# Patient Record
Sex: Male | Born: 1990 | Race: White | Hispanic: No | Marital: Single | Smoking: Current every day smoker
Health system: Southern US, Community
[De-identification: ages and names within clinical notes are randomized; demographics above are authoritative.]

---

## 2015-08-31 ENCOUNTER — Emergency Department
Admission: EM | Admit: 2015-08-31 | Discharge: 2015-09-01 | Disposition: A | Discharge status: Home / Self Care | Payer: Self-pay | Attending: Emergency Medicine | Admitting: Emergency Medicine

## 2015-08-31 DIAGNOSIS — F329 Major depressive disorder, single episode, unspecified: Secondary | ICD-10-CM | POA: Insufficient documentation

## 2015-08-31 DIAGNOSIS — Y9389 Activity, other specified: Secondary | ICD-10-CM | POA: Insufficient documentation

## 2015-08-31 DIAGNOSIS — Y9289 Other specified places as the place of occurrence of the external cause: Secondary | ICD-10-CM | POA: Insufficient documentation

## 2015-08-31 DIAGNOSIS — Z7289 Other problems related to lifestyle: Secondary | ICD-10-CM

## 2015-08-31 DIAGNOSIS — F10120 Alcohol abuse with intoxication, uncomplicated: Secondary | ICD-10-CM | POA: Insufficient documentation

## 2015-08-31 DIAGNOSIS — S60812A Abrasion of left wrist, initial encounter: Secondary | ICD-10-CM | POA: Insufficient documentation

## 2015-08-31 DIAGNOSIS — F10920 Alcohol use, unspecified with intoxication, uncomplicated: Secondary | ICD-10-CM

## 2015-08-31 DIAGNOSIS — Y998 Other external cause status: Secondary | ICD-10-CM | POA: Insufficient documentation

## 2015-08-31 DIAGNOSIS — IMO0002 Reserved for concepts with insufficient information to code with codable children: Secondary | ICD-10-CM

## 2015-08-31 DIAGNOSIS — X789XXA Intentional self-harm by unspecified sharp object, initial encounter: Secondary | ICD-10-CM | POA: Insufficient documentation

## 2015-08-31 DIAGNOSIS — Z23 Encounter for immunization: Secondary | ICD-10-CM | POA: Insufficient documentation

## 2015-08-31 DIAGNOSIS — S50812A Abrasion of left forearm, initial encounter: Secondary | ICD-10-CM | POA: Insufficient documentation

## 2015-08-31 DIAGNOSIS — F32A Depression, unspecified: Secondary | ICD-10-CM

## 2015-08-31 LAB — COMPREHENSIVE METABOLIC PANEL
ALT: 24 U/L (ref 17–63)
ANION GAP: 8 (ref 5–15)
AST: 23 U/L (ref 15–41)
Albumin: 4.6 g/dL (ref 3.5–5.0)
Alkaline Phosphatase: 93 U/L (ref 38–126)
BUN: 13 mg/dL (ref 6–20)
CHLORIDE: 108 mmol/L (ref 101–111)
CO2: 24 mmol/L (ref 22–32)
Calcium: 9.1 mg/dL (ref 8.9–10.3)
Creatinine, Ser: 1.09 mg/dL (ref 0.61–1.24)
GFR calc non Af Amer: >60 mL/min (ref >60)
Glucose, Bld: 110 mg/dL — ABNORMAL HIGH (ref 65–99)
Potassium: 3.7 mmol/L (ref 3.5–5.1)
SODIUM: 140 mmol/L (ref 135–145)
Total Bilirubin: 0.4 mg/dL (ref 0.3–1.2)
Total Protein: 7.6 g/dL (ref 6.5–8.1)

## 2015-08-31 LAB — URINE DRUG SCREEN, QUALITATIVE (ARMC ONLY)
Amphetamines, Ur Screen: NOT DETECTED
BARBITURATES, UR SCREEN: NOT DETECTED
Benzodiazepine, Ur Scrn: NOT DETECTED
COCAINE METABOLITE, UR ~~LOC~~: NOT DETECTED
Cannabinoid 50 Ng, Ur ~~LOC~~: NOT DETECTED
MDMA (ECSTASY) UR SCREEN: NOT DETECTED
METHADONE SCREEN, URINE: NOT DETECTED
Opiate, Ur Screen: NOT DETECTED
Phencyclidine (PCP) Ur S: NOT DETECTED
TRICYCLIC, UR SCREEN: NOT DETECTED

## 2015-08-31 LAB — ETHANOL: Alcohol, Ethyl (B): 134 mg/dL — ABNORMAL HIGH (ref <5)

## 2015-08-31 LAB — CBC
HCT: 45.8 % (ref 40.0–52.0)
HEMOGLOBIN: 15.5 g/dL (ref 13.0–18.0)
MCH: 29.6 pg (ref 26.0–34.0)
MCHC: 33.8 g/dL (ref 32.0–36.0)
MCV: 87.7 fL (ref 80.0–100.0)
Platelets: 260 10*3/uL (ref 150–440)
RBC: 5.23 MIL/uL (ref 4.40–5.90)
RDW: 12.3 % (ref 11.5–14.5)
WBC: 7.9 10*3/uL (ref 3.8–10.6)

## 2015-08-31 LAB — ACETAMINOPHEN LEVEL

## 2015-08-31 LAB — SALICYLATE LEVEL

## 2015-08-31 MED ORDER — TETANUS-DIPHTH-ACELL PERTUSSIS 5-2.5-18.5 LF-MCG/0.5 IM SUSP
0.5000 mL | Freq: Once | INTRAMUSCULAR | Status: AC
  Administered 2015-08-31: 0.5 mL via INTRAMUSCULAR
  Filled 2015-08-31: qty 0.5

## 2015-08-31 NOTE — ED Provider Notes (Signed)
[facility] Emergency Department Provider Note ____________________________________________  Time seen: Approximately 11:26 PM  I have reviewed the triage vital signs and the nursing notes.   HISTORY  Chief Complaint Behavior Problem    HPI George Leach is a 24 y.o. male who presents to the ED under IVC with [facility] police s/p argument with girlfriend. Patient presents with superficial lacerationsto his left wrist from intentional self injury. Patient admits to cutting self previously to "relieve stress". Denies active SI/HI/AH/VH. Has been drinking heavily this evening. Denies medical complaints. Tetanus is not up-to-date.   Past medical history Self-mutilation  There are no active problems to display for this patient.   No past surgical history on file.  No current outpatient prescriptions on file.  Allergies Review of patient's allergies indicates no known allergies.  No family history on file.  Social History Social History  Substance Use Topics  . Smoking status: Not on file  . Smokeless tobacco: Not on file  . Alcohol Use: Not on file  +Smoker +EtOH  Review of Systems Constitutional: No fever/chills Eyes: No visual changes. ENT: No sore throat. Cardiovascular: Denies chest pain. Respiratory: Denies shortness of breath. Gastrointestinal: No abdominal pain.  No nausea, no vomiting.  No diarrhea.  No constipation. Genitourinary: Negative for dysuria. Musculoskeletal: Negative for back pain. Skin: Negative for rash. Neurological: Negative for headaches, focal weakness or numbness. Psychiatric: Positive for depression.  10-point ROS otherwise negative.  ____________________________________________   PHYSICAL EXAM:  VITAL SIGNS: ED Triage Vitals  Enc Vitals Group     BP 08/31/15 2238 125/81 mmHg     Pulse Rate 08/31/15 2238 100     Resp 08/31/15 2238 18     Temp 08/31/15 2238 98.7 F (37.1 C)     Temp Source 08/31/15  2238 Oral     SpO2 08/31/15 2238 99 %     Weight 08/31/15 2238 160 lb (72.576 kg)     Height 08/31/15 2238  (1.778 m)     Head Cir --      Peak Flow --      Pain Score 08/31/15 2239 2     Pain Loc --      Pain Edu? --      Excl. in GC? --     Constitutional: Alert and oriented. Well appearing and in no acute distress. Eyes: Conjunctivae are normal. PERRL. EOMI. Head: Atraumatic. Nose: No congestion/rhinnorhea. Mouth/Throat: Mucous membranes are moist.  Oropharynx non-erythematous. Neck: No stridor.   Cardiovascular: Normal rate, regular rhythm. Grossly normal heart sounds.  Good peripheral circulation. Respiratory: Normal respiratory effort.  No retractions. Lungs CTAB. Gastrointestinal: Soft and nontender. No distention. No abdominal bruits. No CVA tenderness. Musculoskeletal: No lower extremity tenderness nor edema.  No joint effusions. Neurologic:  Normal speech and language. No gross focal neurologic deficits are appreciated. No gait instability. Skin:  Skin is warm and dry. There are multiple linear abrasions to the left wrist and forearm without active bleeding or laceration which requires sutures. There are old, well-healed linear scars on the tops of patient's bilateral thighs from prior cutting. No rash noted. Psychiatric: Mood and affect are normal. Speech and behavior are normal.  ____________________________________________   LABS (all labs ordered are listed, but only abnormal results are displayed)  Labs Reviewed  COMPREHENSIVE METABOLIC PANEL - Abnormal; Notable for the following:    Glucose, Bld 110 (*)    All other components within normal limits  ETHANOL - Abnormal; Notable for the following:  Alcohol, Ethyl (B) 134 (*)    All other components within normal limits  ACETAMINOPHEN LEVEL - Abnormal; Notable for the following:    Acetaminophen (Tylenol), Serum <10 (*)    All other components within normal limits  SALICYLATE LEVEL  CBC  URINE DRUG  SCREEN, QUALITATIVE ([facility] ONLY)   ____________________________________________  EKG  None ____________________________________________  RADIOLOGY  None ____________________________________________   PROCEDURES  Procedure(s) performed: None  Critical Care performed: No  ____________________________________________   INITIAL IMPRESSION / ASSESSMENT AND PLAN / ED COURSE  Pertinent labs & imaging results that were available during my care of the patient were reviewed by me and considered in my medical decision making (see chart for details).  24 year old male with a history of cutting self presents under IVC by [employer] s/p argument with girlfriend. Denies active SI. Will maintain IVC pending [facility] psychiatry consult.  ----------------------------------------- 2:17 AM on 09/01/2015 -----------------------------------------  Patient was evaluated by [facility] psychiatrist Dr. Charmian Muff who has reversed his IVC and refers him to mental health clinic. Strict return precautions given. Patient verbalizes understanding and agrees with plan of care. ____________________________________________   FINAL CLINICAL IMPRESSION(S) / ED DIAGNOSES  Final diagnoses:  Self-inflicted injury  Alcohol intoxication, uncomplicated  Depression      Irean Hong, MD 09/01/15 [phone]

## 2015-08-31 NOTE — ED Notes (Signed)
Pt to ED under IVC with [employer], pt had an argument with girlfriend. Pt presents with superficial lacerations to L wrist. Pt states he is "not able to answer" why he did it.

## 2015-09-01 NOTE — ED Notes (Signed)
BEHAVIORAL HEALTH ROUNDING Patient sleeping: No. Patient alert and oriented: yes Behavior appropriate: Yes.  ; If no, describe:  Nutrition and fluids offered: Yes  Toileting and hygiene offered: Yes  Sitter present: yes Law enforcement present: Yes  

## 2015-09-01 NOTE — ED Notes (Addendum)
BEHAVIORAL HEALTH ROUNDING Patient sleeping: No. Patient alert and oriented: yes Behavior appropriate: Yes.  ; If no, describe:  Nutrition and fluids offered: Yes  Toileting and hygiene offered: Yes  Sitter present: yes Law enforcement present: Yes  

## 2015-09-01 NOTE — Discharge Instructions (Signed)
1. Please call the number provided to seek treatment for depression. 2. Return to the ER for worsening symptoms, feeling of hurting herself or others, or other concerns.  Alcohol Intoxication Alcohol intoxication occurs when the amount of alcohol that a person has consumed impairs his or her ability to mentally and physically function. Alcohol directly impairs the normal chemical activity of the brain. Drinking large amounts of alcohol can lead to changes in mental function and behavior, and it can cause many physical effects that can be harmful.  Alcohol intoxication can range in severity from mild to very severe. Various factors can affect the level of intoxication that occurs, such as the person's age, gender, weight, frequency of alcohol consumption, and the presence of other medical conditions (such as diabetes, seizures, or heart conditions). Dangerous levels of alcohol intoxication may occur when people drink large amounts of alcohol in a short period (binge drinking). Alcohol can also be especially dangerous when combined with certain prescription medicines or "recreational" drugs. SIGNS AND SYMPTOMS Some common signs and symptoms of mild alcohol intoxication include:  Loss of coordination.  Changes in mood and behavior.  Impaired judgment.  Slurred speech. As alcohol intoxication progresses to more severe levels, other signs and symptoms will appear. These may include:  Vomiting.  Confusion and impaired memory.  Slowed breathing.  Seizures.  Loss of consciousness. DIAGNOSIS  Your health care provider will take a medical history and perform a physical exam. You will be asked about the amount and type of alcohol you have consumed. Blood tests will be done to measure the concentration of alcohol in your blood. In many places, your blood alcohol level must be lower than 80 mg/dL (8.11%) to legally drive. However, many dangerous effects of alcohol can occur at much lower levels.    TREATMENT  People with alcohol intoxication often do not require treatment. Most of the effects of alcohol intoxication are temporary, and they go away as the alcohol naturally leaves the body. Your health care provider will monitor your condition until you are stable enough to go home. Fluids are sometimes given through an IV access tube to help prevent dehydration.  HOME CARE INSTRUCTIONS  Do not drive after drinking alcohol.  Stay hydrated. Drink enough water and fluids to keep your urine clear or pale yellow. Avoid caffeine.   Only take over-the-counter or prescription medicines as directed by your health care provider.  SEEK MEDICAL CARE IF:   You have persistent vomiting.   You do not feel better after a few days.  You have frequent alcohol intoxication. Your health care provider can help determine if you should see a substance use treatment counselor. SEEK IMMEDIATE MEDICAL CARE IF:   You become shaky or tremble when you try to stop drinking.   You shake uncontrollably (seizure).   You throw up (vomit) blood. This may be bright red or may look like black coffee grounds.   You have blood in your stool. This may be bright red or may appear as a black, tarry, bad smelling stool.   You become lightheaded or faint.  MAKE SURE YOU:   Understand these instructions.  Will watch your condition.  Will get help right away if you are not doing well or get worse. Document Released: 09/11/2005 Document Revised: 08/04/2013 Document Reviewed: 05/07/2013 [facility] Patient Information 2015 [place], [place]. This information is not intended to replace advice given to you by your health care provider. Make sure you discuss any questions you have with your  health care provider.  Depression Depression is feeling sad, low, down in the dumps, blue, gloomy, or empty. In general, there are two kinds of depression:  Normal sadness or grief. This can happen after something upsetting. It  often goes away on its own within 2 weeks. After losing a loved one (bereavement), normal sadness and grief may last longer than two weeks. It usually gets better with time.  Clinical depression. This kind lasts longer than normal sadness or grief. It keeps you from doing the things you normally do in life. It is often hard to function at home, work, or at school. It may affect your relationships with others. Treatment is often needed. GET HELP RIGHT AWAY IF:  You have thoughts about hurting yourself or others.  You lose touch with reality (psychotic symptoms). You may:  See or hear things that are not real.  Have untrue beliefs about your life or people around you.  Your medicine is giving you problems. MAKE SURE YOU:  Understand these instructions.  Will watch your condition.  Will get help right away if you are not doing well or get worse. Document Released: 01/04/2011 Document Revised: 04/18/2014 Document Reviewed: 04/02/2012 [facility] Patient Information 2015 [place], [place]. This information is not intended to replace advice given to you by your health care provider. Make sure you discuss any questions you have with your health care provider.

## 2015-09-01 NOTE — ED Notes (Signed)

## 2015-09-01 NOTE — BH Assessment (Signed)
Assessment Note  George Leach is an 24 y.o. male presenting to the ED under IVC by [employer] after getting into an argument with his girlfriend.  Pt has superficial cuts on his arms and legs.  Pt denies cutting to end his life but as coping mechanism to cope with stress and anxiety.  Pt reports ongoing relationship conflict with his girlfriend.  Pt reports drinking 3 or 4 12 oz beers a day.  Pt denies any other drug use.  Pt denies any SI/HI and A/V hallucinations.  Pt reports he has never received any type of treatment for his anxieties but is interested in getting help.  Axis I: See current hospital problem list  Past Medical History: No past medical history on file.  No past surgical history on file.  Family History: No family history on file.  Social History:  has no tobacco, alcohol, and drug history on file.  Additional Social History:  Alcohol / Drug Use History of alcohol / drug use?: Yes Substance #1 Name of Substance 1: ETOH 1 - Age of First Use: 23 1 - Amount (size/oz): 4 12 oz beers 1 - Frequency: daily 1 - Duration: after work 1 - Last Use / Amount: 08/31/2015  CIWA: CIWA-Ar BP: 125/81 mmHg Pulse Rate: 100 COWS:    Allergies: No Known Allergies  Home Medications:  (Not in a hospital admission)  OB/GYN Status:  No LMP for male patient.  General Assessment Data Location of Assessment: [facility] ED TTS Assessment: In system Is this a Tele or Face-to-Face Assessment?: Face-to-Face Is this an Initial Assessment or a Re-assessment for this encounter?: Initial Assessment Marital status: Single Maiden name: N/A Is patient pregnant?: No Pregnancy Status: No Living Arrangements: Spouse/significant other Can pt return to current living arrangement?: Yes Admission Status: Involuntary Is patient capable of signing voluntary admission?: Yes Referral Source: Other Insurance type: None  Medical Screening Exam [facility] Walk-in ONLY) Medical Exam completed: Yes  Crisis Care  Plan Living Arrangements: Spouse/significant other Name of Psychiatrist: None Name of Therapist: None  Education Status Is patient currently in school?: No Current Grade: N/A Highest grade of school patient has completed: 11th Name of school: N/A Contact person: N/A  Risk to self with the past 6 months Suicidal Ideation: No Has patient been a risk to self within the past 6 months prior to admission? : No Suicidal Intent: No Has patient had any suicidal intent within the past 6 months prior to admission? : No Is patient at risk for suicide?: No Suicidal Plan?: No Has patient had any suicidal plan within the past 6 months prior to admission? : No Access to Means: No What has been your use of drugs/alcohol within the last 12 months?: 4 12 oz beers daily Previous Attempts/Gestures: No How many times?: 0 Other Self Harm Risks: N/A Triggers for Past Attempts: None known Intentional Self Injurious Behavior: Cutting Comment - Self Injurious Behavior: Pt has superficial cuts on arms and legs Family Suicide History: Unknown Recent stressful life event(s): Conflict (Comment) (Relationships issues with girlfriend) Persecutory voices/beliefs?: No Depression: Yes Depression Symptoms: Loss of interest in usual pleasures, Feeling worthless/self pity Substance abuse history and/or treatment for substance abuse?: No Suicide prevention information given to non-admitted patients: Not applicable  Risk to Others within the past 6 months Homicidal Ideation: No Does patient have any lifetime risk of violence toward others beyond the six months prior to admission? : No Thoughts of Harm to Others: No Current Homicidal Intent: No Current Homicidal Plan: No  Access to Homicidal Means: No Identified Victim: N/A History of harm to others?: No Assessment of Violence: On admission Violent Behavior Description: N/A Does patient have access to weapons?: No Criminal Charges Pending?: No Does patient  have a court date: No Is patient on probation?: No  Psychosis Hallucinations: None noted Delusions: None noted  Mental Status Report Appearance/Hygiene: In scrubs Eye Contact: Fair Motor Activity: Freedom of movement Speech: Logical/coherent, Soft Level of Consciousness: Alert Mood: Anxious Affect: Anxious, Appropriate to circumstance Anxiety Level: Minimal Thought Processes: Coherent Judgement: Partial Orientation: Person, Place, Situation, Time Obsessive Compulsive Thoughts/Behaviors: None  Cognitive Functioning Concentration: Normal Memory: Recent Intact IQ: Average Insight: Poor Impulse Control: Poor Appetite: Fair Weight Loss: 0 Weight Gain: 0 Sleep: No Change Vegetative Symptoms: None  ADLScreening [facility] Assessment Services) Patient's cognitive ability adequate to safely complete daily activities?: Yes Patient able to express need for assistance with ADLs?: Yes Independently performs ADLs?: Yes (appropriate for developmental age)  Prior Inpatient Therapy Prior Inpatient Therapy: No Prior Therapy Dates: N/A Prior Therapy Facilty/Provider(s): N/A Reason for Treatment: N/A  Prior Outpatient Therapy Prior Outpatient Therapy: No Prior Therapy Dates: N/A Prior Therapy Facilty/Provider(s): N/A Reason for Treatment: N/A Does patient have an ACCT team?: No Does patient have Intensive In-House Services?  : No Does patient have [facility] services? : No Does patient have P4CC services?: No  ADL Screening (condition at time of admission) Patient's cognitive ability adequate to safely complete daily activities?: Yes Patient able to express need for assistance with ADLs?: Yes Independently performs ADLs?: Yes (appropriate for developmental age)       Abuse/Neglect Assessment (Assessment to be complete while patient is alone) Physical Abuse: Denies Verbal Abuse: Denies Sexual Abuse: Denies Exploitation of patient/patient's resources: Denies Self-Neglect:  Denies Values / Beliefs Cultural Requests During Hospitalization: None Spiritual Requests During Hospitalization: None Consults Spiritual Care Consult Needed: No Social Work Consult Needed: No Merchant navy officer (For Healthcare) Does patient have an advance directive?: No Would patient like information on creating an advanced directive?: No - patient declined information    Additional Information 1:1 In Past 12 Months?: No CIRT Risk: No Elopement Risk: No     Disposition:  Disposition Initial Assessment Completed for this Encounter: Yes Disposition of Patient: Other dispositions Other disposition(s): Other (Comment) [facility] consult)  On Site Evaluation by:   Reviewed with Physician:    Roxana C Brand 09/01/2015 1:14 AM

## 2016-04-20 ENCOUNTER — Emergency Department: Payer: Self-pay

## 2016-04-20 ENCOUNTER — Encounter: Payer: Self-pay | Admitting: Emergency Medicine

## 2016-04-20 ENCOUNTER — Emergency Department
Admission: EM | Admit: 2016-04-20 | Discharge: 2016-04-21 | Disposition: A | Discharge status: Home / Self Care | Payer: Self-pay | Attending: Emergency Medicine | Admitting: Emergency Medicine

## 2016-04-20 DIAGNOSIS — F1910 Other psychoactive substance abuse, uncomplicated: Secondary | ICD-10-CM

## 2016-04-20 DIAGNOSIS — F1410 Cocaine abuse, uncomplicated: Secondary | ICD-10-CM | POA: Insufficient documentation

## 2016-04-20 DIAGNOSIS — R569 Unspecified convulsions: Secondary | ICD-10-CM | POA: Insufficient documentation

## 2016-04-20 DIAGNOSIS — F17210 Nicotine dependence, cigarettes, uncomplicated: Secondary | ICD-10-CM | POA: Insufficient documentation

## 2016-04-20 LAB — URINE DRUG SCREEN, QUALITATIVE (ARMC ONLY)
AMPHETAMINES, UR SCREEN: POSITIVE — AB
Barbiturates, Ur Screen: NOT DETECTED
Benzodiazepine, Ur Scrn: POSITIVE — AB
Cannabinoid 50 Ng, Ur ~~LOC~~: POSITIVE — AB
Cocaine Metabolite,Ur ~~LOC~~: POSITIVE — AB
MDMA (ECSTASY) UR SCREEN: NOT DETECTED
Methadone Scn, Ur: NOT DETECTED
Opiate, Ur Screen: NOT DETECTED
Phencyclidine (PCP) Ur S: NOT DETECTED
TRICYCLIC, UR SCREEN: POSITIVE — AB

## 2016-04-20 LAB — CBC WITH DIFFERENTIAL/PLATELET
BASOS ABS: 0.1 10*3/uL (ref 0–0.1)
EOS ABS: 0.2 10*3/uL (ref 0–0.7)
HCT: 48 % (ref 40.0–52.0)
Hemoglobin: 16.1 g/dL (ref 13.0–18.0)
Lymphocytes Relative: 15 %
Lymphs Abs: 1.6 10*3/uL (ref 1.0–3.6)
MCH: 29.1 pg (ref 26.0–34.0)
MCHC: 33.6 g/dL (ref 32.0–36.0)
MCV: 86.6 fL (ref 80.0–100.0)
Monocytes Absolute: 1 10*3/uL (ref 0.2–1.0)
Monocytes Relative: 9 %
Neutro Abs: 8.4 10*3/uL — ABNORMAL HIGH (ref 1.4–6.5)
Neutrophils Relative %: 73 %
PLATELETS: 272 10*3/uL (ref 150–440)
RBC: 5.54 MIL/uL (ref 4.40–5.90)
RDW: 13.4 % (ref 11.5–14.5)
WBC: 11.3 10*3/uL — AB (ref 3.8–10.6)

## 2016-04-20 LAB — TROPONIN I: Troponin I: <0.03 ng/mL (ref <0.031)

## 2016-04-20 LAB — ETHANOL

## 2016-04-20 LAB — COMPREHENSIVE METABOLIC PANEL
ALT: 20 U/L (ref 17–63)
AST: 32 U/L (ref 15–41)
Albumin: 4.7 g/dL (ref 3.5–5.0)
Alkaline Phosphatase: 92 U/L (ref 38–126)
Anion gap: 12 (ref 5–15)
BUN: 14 mg/dL (ref 6–20)
CHLORIDE: 104 mmol/L (ref 101–111)
CO2: 21 mmol/L — AB (ref 22–32)
CREATININE: 1.53 mg/dL — AB (ref 0.61–1.24)
Calcium: 9.2 mg/dL (ref 8.9–10.3)
GFR calc Af Amer: >60 mL/min (ref >60)
GFR calc non Af Amer: >60 mL/min (ref >60)
Glucose, Bld: 203 mg/dL — ABNORMAL HIGH (ref 65–99)
Potassium: 3.5 mmol/L (ref 3.5–5.1)
SODIUM: 137 mmol/L (ref 135–145)
Total Bilirubin: 1.1 mg/dL (ref 0.3–1.2)
Total Protein: 8 g/dL (ref 6.5–8.1)

## 2016-04-20 MED ORDER — DIAZEPAM 5 MG/ML IJ SOLN
5.0000 mg | Freq: Once | INTRAMUSCULAR | Status: AC
  Administered 2016-04-20: 5 mg via INTRAVENOUS
  Filled 2016-04-20: qty 2

## 2016-04-20 MED ORDER — KETOROLAC TROMETHAMINE 30 MG/ML IJ SOLN
30.0000 mg | Freq: Once | INTRAMUSCULAR | Status: AC
  Administered 2016-04-20: 30 mg via INTRAVENOUS
  Filled 2016-04-20: qty 1

## 2016-04-20 MED ORDER — SODIUM CHLORIDE 0.9 % IV SOLN
Freq: Once | INTRAVENOUS | Status: AC
  Administered 2016-04-20: 23:00:00 via INTRAVENOUS

## 2016-04-20 NOTE — ED Provider Notes (Signed)
[facility] Emergency Department Provider Note        Time seen: ----------------------------------------- 10:37 PM on 04/20/2016 -----------------------------------------    I have reviewed the triage vital signs and the nursing notes.   HISTORY  Chief Complaint Seizures; Fall; and Back Pain    HPI George Leach is a 25 y.o. male who presents to ER after having had a seizure event today. His girlfriend states he was using cocaine, drinking, had had an Adderall, was playing video games and had not slept in 2 days. She states he started shaking this lasted 3-5 minutes, he was then confused for about 20 minutes. She states during the event he quit breathing and is now turned purple. Since that time his had chest and back pain. Patient denies a need for detox. He has never had a seizure before.   History reviewed. No pertinent past medical history.  There are no active problems to display for this patient.   History reviewed. No pertinent past surgical history.  Allergies Review of patient's allergies indicates no known allergies.  Social History Social History  Substance Use Topics  . Smoking status: Current Every Day Smoker -- 0.50 packs/day    Types: Cigarettes  . Smokeless tobacco: None  . Alcohol Use: Yes    Review of Systems Constitutional: Negative for fever. Eyes: Negative for visual changes. ENT: Negative for sore throat. Cardiovascular: Positive for chest pain Respiratory: Negative for shortness of breath. Gastrointestinal: Negative for abdominal pain, vomiting and diarrhea. Genitourinary: Negative for dysuria. Musculoskeletal: Positive for back pain Skin: Negative for rash. Neurological: Negative for headaches, focal weakness or numbness.  10-point ROS otherwise negative.  ____________________________________________   PHYSICAL EXAM:  VITAL SIGNS: ED Triage Vitals  Enc Vitals Group     BP 04/20/16 2215 161/96 mmHg      Pulse Rate 04/20/16 2215 92     Resp 04/20/16 2215 18     Temp 04/20/16 2215 98.4 F (36.9 C)     Temp Source 04/20/16 2215 Oral     SpO2 04/20/16 2215 98 %     Weight 04/20/16 2215 150 lb (68.04 kg)     Height 04/20/16 2215  (1.803 m)     Head Cir --      Peak Flow --      Pain Score 04/20/16 2216 10     Pain Loc --      Pain Edu? --      Excl. in GC? --     Constitutional: Alert and oriented. Well appearing and in no distress. Eyes: Conjunctivae are normal. PERRL. Normal extraocular movements. ENT   Head: Normocephalic and atraumatic.   Nose: No congestion/rhinnorhea.   Mouth/Throat: Mucous membranes are moist.   Neck: No stridor. Cardiovascular: Normal rate, regular rhythm. No murmurs, rubs, or gallops. Respiratory: Normal respiratory effort without tachypnea nor retractions. Breath sounds are clear and equal bilaterally. No wheezes/rales/rhonchi. Gastrointestinal: Soft and nontender. Normal bowel sounds Musculoskeletal: Nontender with normal range of motion in all extremities. No lower extremity tenderness nor edema. Neurologic:  Normal speech and language. No gross focal neurologic deficits are appreciated.  Skin:  Skin is warm, dry and intact. No rash noted. Psychiatric: Mood and affect are normal. Speech and behavior are normal.  ____________________________________________  EKG: Interpreted by me. Sinus rhythm with a rate of 84 bpm, normal PR interval, normal QRS, normal QT interval. Normal axis. Early re-pole pattern  ____________________________________________  ED COURSE:  Pertinent labs & imaging results that were  available during my care of the patient were reviewed by me and considered in my medical decision making (see chart for details). Patient presents in no acute distress, will check basic labs, tox screen and imaging for his seizure ____________________________________________    LABS (pertinent positives/negatives)  Labs Reviewed   CBC WITH DIFFERENTIAL/PLATELET  COMPREHENSIVE METABOLIC PANEL  TROPONIN I  URINE DRUG SCREEN, QUALITATIVE ([facility] ONLY)  ETHANOL    RADIOLOGY  CT head, chest x-ray Are pending at this time ____________________________________________  FINAL ASSESSMENT AND PLAN  Seizure, polysubstance abuse  Plan: Patient with labs and imaging as dictated above. X-rays and lab work are pending at this time. He was given Valium for muscle relaxation.   Emily FilbertWilliams, Jonathan E, MD   Note: This dictation was prepared with Dragon dictation. Any transcriptional errors that result from this process are unintentional   Emily FilbertJonathan E Williams, MD 04/20/16 2239

## 2016-04-20 NOTE — ED Notes (Signed)
Pt to CT and XR at this time.

## 2016-04-20 NOTE — ED Notes (Addendum)
Pt says about 6am today he had a seizure and fell; was just trying to "wait it out"; pt admits to using alcohol and cocaine since Friday night around 6pm until the incident happened; c/o pain to his entire torso; fell on hardwood floor; pt restless in triage; short of breath since fall "I think due to pain"; pt with no history of seizure; pt does not want help with drug use at this time;

## 2016-04-20 NOTE — ED Notes (Signed)
Pt taken back to treatment room 17 via wheelchair; verbal report to Dr Mayford KnifeWilliams and RN covering pt at this time; girlfriend present says pt "quit breathing" for an unknown amount of time and then had difficulty "forming sentences for about 20 minutes";

## 2016-04-21 NOTE — ED Provider Notes (Signed)
-----------------------------------------   12:04 AM on 04/21/2016 -----------------------------------------   Blood pressure 141/100, pulse 84, temperature 98.4 F (36.9 C), temperature source Oral, resp. rate 21, height 5\' 11"  (1.803 m), weight 150 lb (68.04 kg), SpO2 98 %.  Assuming care from Dr. Mayford KnifeWilliams.  In short, George Leach is a 25 y.o. male with a chief complaint of Seizures; Fall; and Back Pain .  Refer to the original H&P for additional details.  The current plan of care is to follow up the results of the patient's blood work and disposition the patient..   The patient's CBC shows a white count of 11.3 but is otherwise unremarkable. The patient's CMP has a glucose of 203 and a creatinine of 1.53 but again is otherwise unremarkable. The patient's urine drug screen is positive for tricyclics, amphetamines, cocaine, cannabinoids and benzodiazepines.  CT head: No acute intracranial abnormalities.  Chest x-ray: No active cardiopulmonary disease.  I feel that the combination of the patient's lack of sleep and substance abuse is what has caused the patient's seizure. As the patient's blood work is unremarkable and his imaging is unremarkable I will discharge the patient to home. He did receive a dose of Toradol as he is hurting all over which is likely from the stress of the seizure. He'll be discharged home to follow-up with neurology.  Rebecka ApleyAllison P Erryn Dickison, MD 04/21/16 0005

## 2016-04-21 NOTE — Discharge Instructions (Signed)
Seizure, Adult A seizure is abnormal electrical activity in the brain. Seizures usually last from 30 seconds to 2 minutes. There are various types of seizures. Before a seizure, you may have a warning sensation (aura) that a seizure is about to occur. An aura may include the following symptoms:   Fear or anxiety.  Nausea.  Feeling like the room is spinning (vertigo).  Vision changes, such as seeing flashing lights or spots. Common symptoms during a seizure include:  A change in attention or behavior (altered mental status).  Convulsions with rhythmic jerking movements.  Drooling.  Rapid eye movements.  Grunting.  Loss of bladder and bowel control.  Bitter taste in the mouth.  Tongue biting. After a seizure, you may feel confused and sleepy. You may also have an injury resulting from convulsions during the seizure. HOME CARE INSTRUCTIONS   If you are given medicines, take them exactly as prescribed by your health care provider.  Keep all follow-up appointments as directed by your health care provider.  Do not swim or drive or engage in risky activity during which a seizure could cause further injury to you or others until your health care provider says it is OK.  Get adequate rest.  Teach friends and family what to do if you have a seizure. They should:  Lay you on the ground to prevent a fall.  Put a cushion under your head.  Loosen any tight clothing around your neck.  Turn you on your side. If vomiting occurs, this helps keep your airway clear.  Stay with you until you recover.  Know whether or not you need emergency care. SEEK IMMEDIATE MEDICAL CARE IF:  The seizure lasts longer than 5 minutes.  The seizure is severe or you do not wake up immediately after the seizure.  You have an altered mental status after the seizure.  You are having more frequent or worsening seizures. Someone should drive you to the emergency department or call local emergency  services (911 in [place] MAKE SURE YOU:  Understand these instructions.  Will watch your condition.  Will get help right away if you are not doing well or get worse.   This information is not intended to replace advice given to you by your health care provider. Make sure you discuss any questions you have with your health care provider.   Document Released: 11/29/2000 Document Revised: 12/23/2014 Document Reviewed: 07/14/2013 Elsevier Interactive Patient Education 2016 [employer].  Polysubstance Abuse When people abuse more than one drug or type of drug it is called polysubstance or polydrug abuse. For example, many smokers also drink alcohol. This is one form of polydrug abuse. Polydrug abuse also refers to the use of a drug to counteract an unpleasant effect produced by another drug. It may also be used to help with withdrawal from another drug. People who take stimulants may become agitated. Sometimes this agitation is countered with a tranquilizer. This helps protect against the unpleasant side effects. Polydrug abuse also refers to the use of different drugs at the same time.  Anytime drug use is interfering with normal living activities, it has become abuse. This includes problems with family and friends. Psychological dependence has developed when your mind tells you that the drug is needed. This is usually followed by physical dependence which has developed when continuing increases of drug are required to get the same feeling or "high". This is known as addiction or chemical dependency. A person's risk is much higher if there is  a history of chemical dependency in the family. SIGNS OF CHEMICAL DEPENDENCY  You have been told by friends or family that drugs have become a problem.  You fight when using drugs.  You are having blackouts (not remembering what you do while using).  You feel sick from using drugs but continue using.  You lie about use or amounts of drugs (chemicals)  used.  You need chemicals to get you going.  You are suffering in work performance or in school because of drug use.  You get sick from use of drugs but continue to use anyway.  You need drugs to relate to people or feel comfortable in social situations.  You use drugs to forget problems. "Yes" answered to any of the above signs of chemical dependency indicates there are problems. The longer the use of drugs continues, the greater the problems will become. If there is a family history of drug or alcohol use, it is best not to experiment with these drugs. Continual use leads to tolerance. After tolerance develops more of the drug is needed to get the same feeling. This is followed by addiction. With addiction, drugs become the most important part of life. It becomes more important to take drugs than participate in the other usual activities of life. This includes relating to friends and family. Addiction is followed by dependency. Dependency is a condition where drugs are now needed not just to get high, but to feel normal. Addiction cannot be cured but it can be stopped. This often requires outside help and the care of professionals. Treatment centers are listed in the yellow pages under: Cocaine, Narcotics, and Alcoholics Anonymous. Most hospitals and clinics can refer you to a specialized care center. Talk to your caregiver if you need help.   This information is not intended to replace advice given to you by your health care provider. Make sure you discuss any questions you have with your health care provider.   Document Released: 07/24/2005 Document Revised: 02/24/2012 Document Reviewed: 12/07/2014 Elsevier Interactive Patient Education [employer] Elsevier Inc.

## 2016-07-28 ENCOUNTER — Encounter: Payer: Self-pay | Admitting: *Deleted

## 2016-07-28 ENCOUNTER — Emergency Department
Admission: EM | Admit: 2016-07-28 | Discharge: 2016-07-28 | Disposition: A | Discharge status: Home / Self Care | Payer: Self-pay | Attending: Emergency Medicine | Admitting: Emergency Medicine

## 2016-07-28 DIAGNOSIS — F1490 Cocaine use, unspecified, uncomplicated: Secondary | ICD-10-CM | POA: Insufficient documentation

## 2016-07-28 DIAGNOSIS — N342 Other urethritis: Secondary | ICD-10-CM | POA: Insufficient documentation

## 2016-07-28 DIAGNOSIS — F17210 Nicotine dependence, cigarettes, uncomplicated: Secondary | ICD-10-CM | POA: Insufficient documentation

## 2016-07-28 LAB — URINALYSIS COMPLETE WITH MICROSCOPIC (ARMC ONLY)
Bilirubin Urine: NEGATIVE
Glucose, UA: NEGATIVE mg/dL
HGB URINE DIPSTICK: NEGATIVE
Ketones, ur: NEGATIVE mg/dL
NITRITE: NEGATIVE
PH: 6 (ref 5.0–8.0)
PROTEIN: 30 mg/dL — AB
SPECIFIC GRAVITY, URINE: 1.019 (ref 1.005–1.030)
Squamous Epithelial / LPF: NONE SEEN

## 2016-07-28 LAB — CHLAMYDIA/NGC RT PCR (ARMC ONLY)
CHLAMYDIA TR: NOT DETECTED
N gonorrhoeae: NOT DETECTED

## 2016-07-28 MED ORDER — AZITHROMYCIN 500 MG PO TABS
1000.0000 mg | ORAL_TABLET | Freq: Once | ORAL | Status: AC
  Administered 2016-07-28: 1000 mg via ORAL

## 2016-07-28 MED ORDER — CEFTRIAXONE SODIUM 250 MG IJ SOLR
250.0000 mg | Freq: Once | INTRAMUSCULAR | Status: AC
  Administered 2016-07-28: 250 mg via INTRAMUSCULAR

## 2016-07-28 MED ORDER — LIDOCAINE HCL (PF) 1 % IJ SOLN
0.9000 mL | Freq: Once | INTRAMUSCULAR | Status: AC
  Administered 2016-07-28: 0.9 mL

## 2016-07-28 NOTE — ED Provider Notes (Signed)
[facility] Emergency Department Provider Note  ____________________________________________  Time seen: Approximately 11:40 AM  I have reviewed the triage vital signs and the nursing notes.   HISTORY  Chief Complaint Dysuria    HPI George Leach is a 25 y.o. male , NAD, presents to emergency with five-day history of dysuria. Patient states that his girlfriend had a UTI approximately 2 weeks ago. He has had dysuria without hematuria nor urethral discharge over the last 5 days. Pain is usually a beginning of the stream but can last throughout the urinary stream. Denies any abdominal pain, nausea, vomiting. Has had no loss of bladder control nor south paresthesias. Patient notes he has been sexually active with one male partner over the last 6 months and she denies any current symptoms. Patient has not been tested for STI's over the last 6 months.   History reviewed. No pertinent past medical history.  There are no active problems to display for this patient.   History reviewed. No pertinent surgical history.  Prior to Admission medications   Not on File    Allergies Review of patient's allergies indicates no known allergies.  No family history on file.  Social History Social History  Substance Use Topics  . Smoking status: Current Every Day Smoker    Packs/day: 0.50    Types: Cigarettes  . Smokeless tobacco: Never Used  . Alcohol use Yes     Review of Systems  Constitutional: No fever/chills, night sweats, unwanted weight loss, body aches Cardiovascular: No chest pain. Respiratory: No shortness of breath. No wheezing.  Gastrointestinal: No abdominal pain.  No nausea, vomiting.   Genitourinary: Positive for dysuria. No hematuria, urethral discharge. No urinary hesitancy, urgency or increased frequency. Musculoskeletal: Negative for back pain.  Skin: Negative for rash, redness, swelling, skin sores. Neurological: Negative for headaches,  focal weakness or numbness. No tingling 10-point ROS otherwise negative.  ____________________________________________   PHYSICAL EXAM:  VITAL SIGNS: ED Triage Vitals [07/28/16 1137]  Enc Vitals Group     BP      Pulse      Resp      Temp      Temp src      SpO2      Weight 150 lb (68 kg)     Height 5\' 10"  (1.778 m)     Head Circumference      Peak Flow      Pain Score      Pain Loc      Pain Edu?      Excl. in GC?      Constitutional: Alert and oriented. Well appearing and in no acute distress. Eyes: Conjunctivae are normal without icterus or injection Head: Atraumatic. Neck: Supple with FROM Hematological/Lymphatic/Immunilogical: No cervical lymphadenopathy. Cardiovascular: Normal rate, regular rhythm. Normal S1 and S2. No murmurs, rubs, gallops. Good peripheral circulation. Respiratory: Normal respiratory effort without tachypnea or retractions. Lungs CTAB with breath sounds noted in all lung fields. Gastrointestinal: No CVA tenderness. Neurologic:  Normal speech and language. No gross focal neurologic deficits are appreciated.  Skin:  Skin is warm, dry and intact. No rash noted. Psychiatric: Mood and affect are normal. Speech and behavior are normal. Patient exhibits appropriate insight and judgement.   ____________________________________________   LABS (all labs ordered are listed, but only abnormal results are displayed)  Labs Reviewed  URINALYSIS COMPLETEWITH MICROSCOPIC ([facility] ONLY) - Abnormal; Notable for the following:       Result Value   Color, Urine YELLOW (*)  APPearance CLEAR (*)    Protein, ur 30 (*)    Leukocytes, UA TRACE (*)    Bacteria, UA RARE (*)    All other components within normal limits  CHLAMYDIA/NGC RT PCR ([facility] ONLY)    ____________________________________________  EKG  None ____________________________________________  RADIOLOGY  None ____________________________________________    PROCEDURES  Procedure(s) performed: None   Procedures   Medications  cefTRIAXone (ROCEPHIN) injection 250 mg (250 mg Intramuscular Given 07/28/16 1234)  azithromycin (ZITHROMAX) tablet 1,000 mg (1,000 mg Oral Given 07/28/16 1233)  lidocaine (PF) (XYLOCAINE) 1 % injection 0.9 mL (0.9 mLs Other Given 07/28/16 1234)     ____________________________________________   INITIAL IMPRESSION / ASSESSMENT AND PLAN / ED COURSE  Pertinent labs & imaging results that were available during my care of the patient were reviewed by me and considered in my medical decision making (see chart for details).  Clinical Course    Patient's diagnosis is consistent with urethritis. Patient was treated with IM Rocephin and oral azithromycin to cover for potential gonorrhea or chlamydial infection. At this time urine gonorrhea and chlamydia culture results are not available. Will call the patient with results. Patient will be discharged home with instructions to abstain from sexual intercourse until lab results are available. If positive for gonorrhea and chlamydia will need follow-up with the [facility] within 1 week for repeat testing to ensure infection has been eradicated.  Patient is given ED precautions to return to the ED for any worsening or new symptoms.    ____________________________________________  FINAL CLINICAL IMPRESSION(S) / ED DIAGNOSES  Final diagnoses:  Urethritis      NEW MEDICATIONS STARTED DURING THIS VISIT:  New Prescriptions   No medications on file         Hope Pigeon, PA-C 07/28/16 1248    Minna Antis, MD 07/28/16 [phone]

## 2016-07-28 NOTE — ED Triage Notes (Signed)
Pt reports dysuria since Tuesday with painful urination

## 2016-07-28 NOTE — ED Notes (Signed)

## 2016-07-28 NOTE — Discharge Instructions (Signed)
Abstain from all intercourse until you have been notified of negative results. If culture results are positive, you will need to follow up with the [facility] in 1 week for repeat testing to ensure infection has cleared.

## 2017-01-16 MED ORDER — APIXABAN 5 MG PO TABS
ORAL_TABLET | ORAL | Status: AC
  Filled 2017-01-16: qty 2

## 2017-02-04 IMAGING — CT CT HEAD W/O CM
1 series · 16 of 30 positions shown, 20 images · non-contrast
Comparison: None.

CLINICAL DATA: Seizure last night after excessive cocaine
consumption per patient, struck left temporal area, apneic for 1
minute per witness

EXAM:
CT HEAD WITHOUT CONTRAST
TECHNIQUE: Contiguous axial images were obtained from the base of the skull
through the vertex without intravenous contrast.

[Series 2: head wo · axial · 0.47mm/px · z∈[-161,-28]mm · 16 of 30 slices shown, 20 images]
[im 2/30  brain]
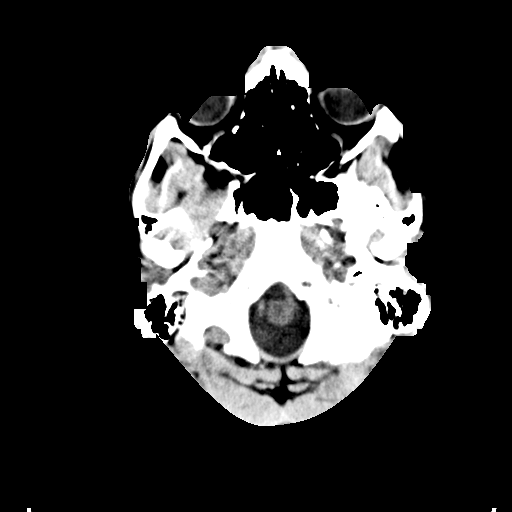
[im 2/30  bone]
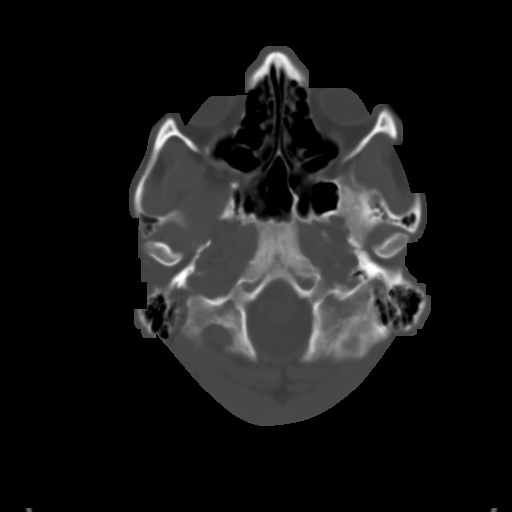
[im 4/30  brain]
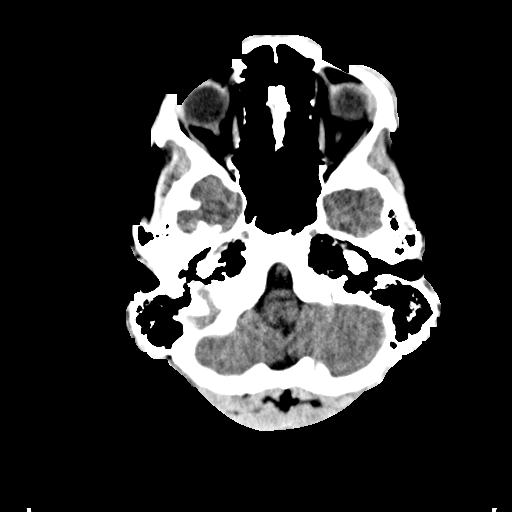
[im 6/30  brain]
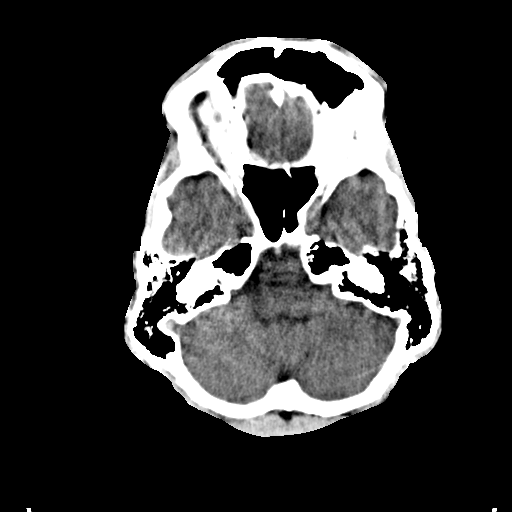
[im 8/30  brain]
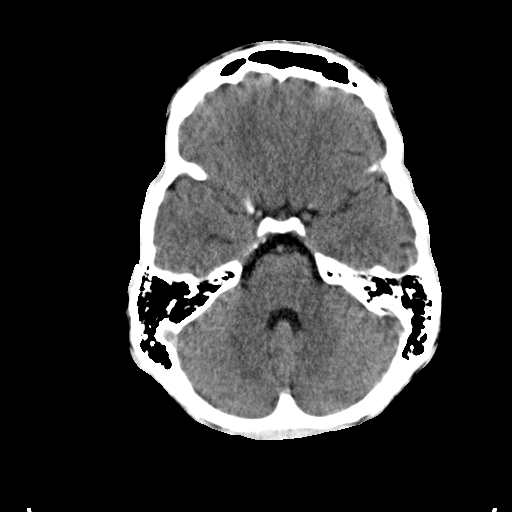
[im 9/30  brain]
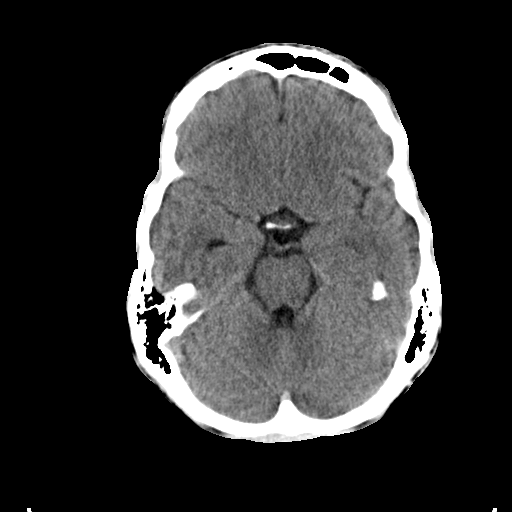
[im 9/30  bone]
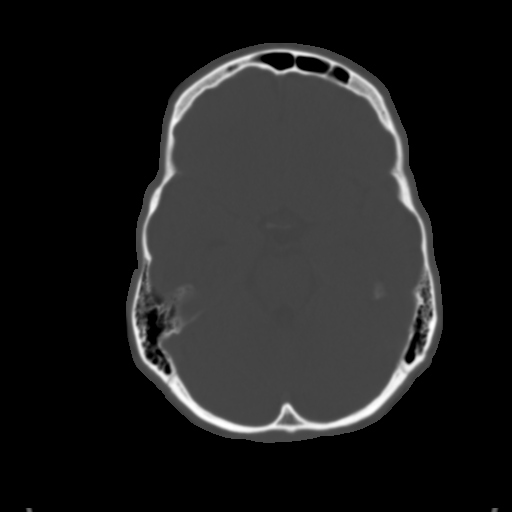
[im 11/30  brain]
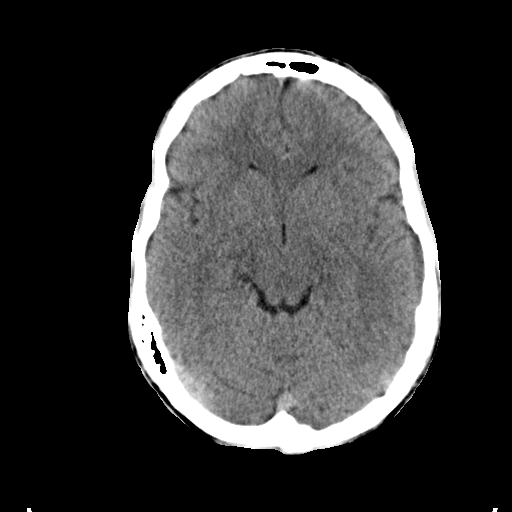
[im 13/30  brain]
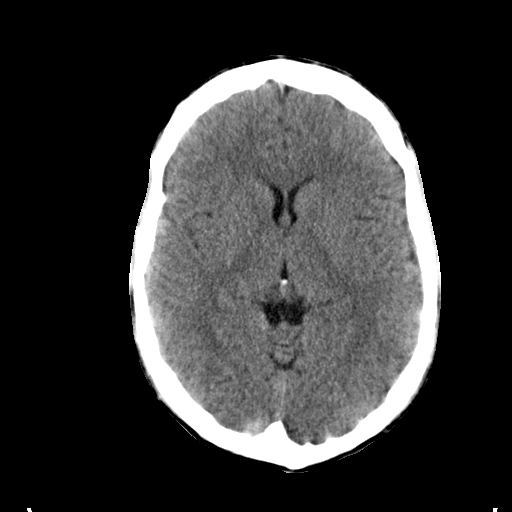
[im 15/30  brain]
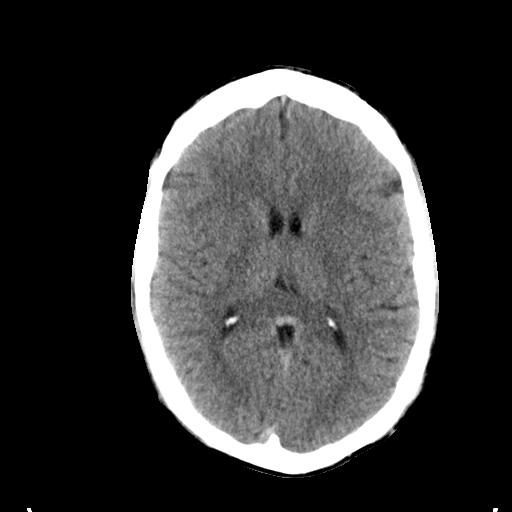
[im 16/30  brain]
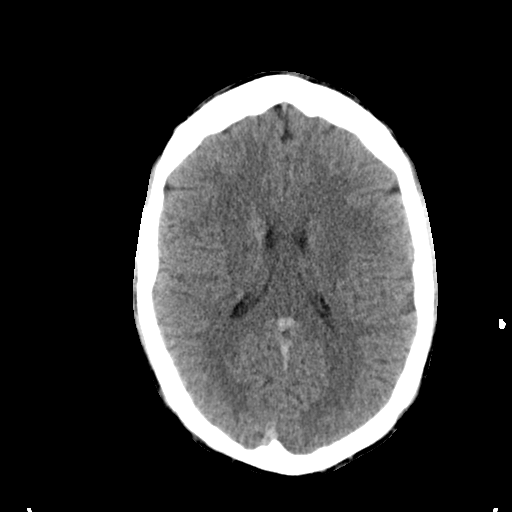
[im 16/30  bone]
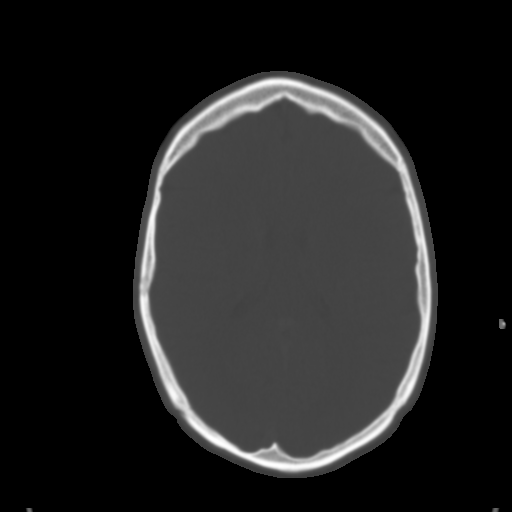
[im 18/30  brain]
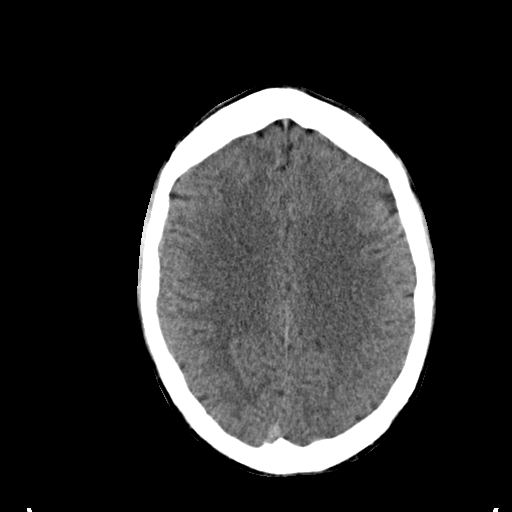
[im 20/30  brain]
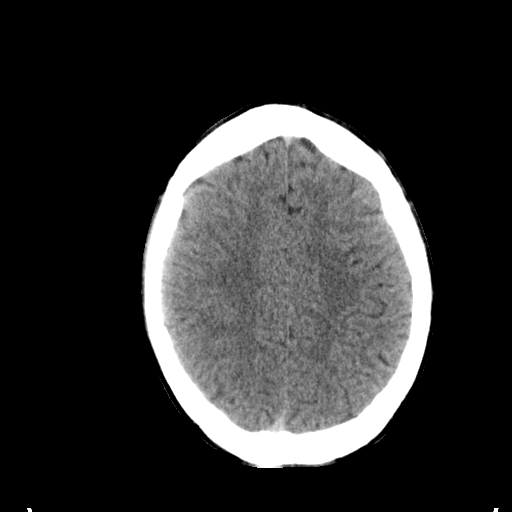
[im 22/30  brain]
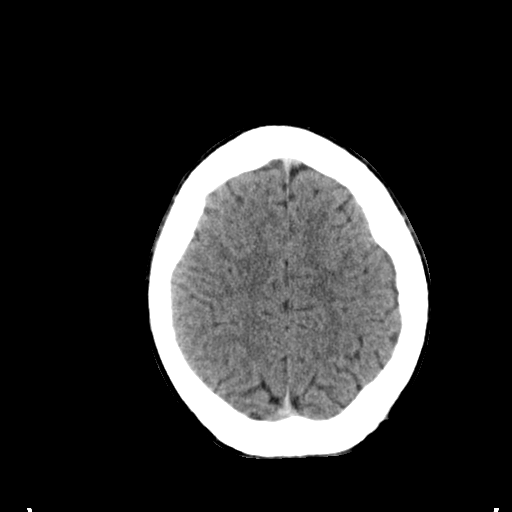
[im 23/30  brain]
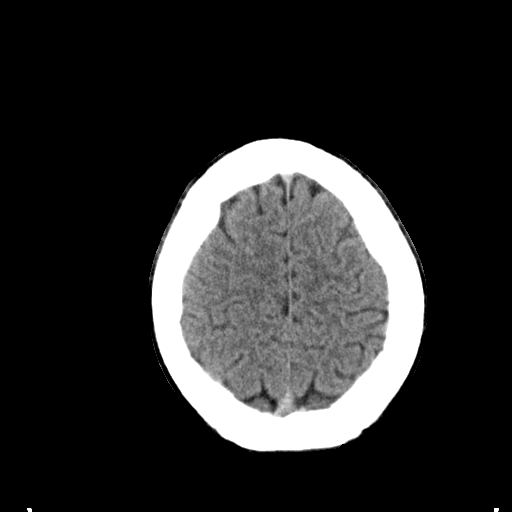
[im 23/30  bone]
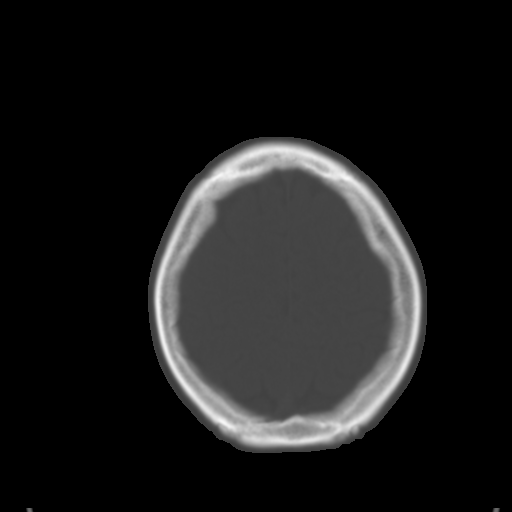
[im 25/30  brain]
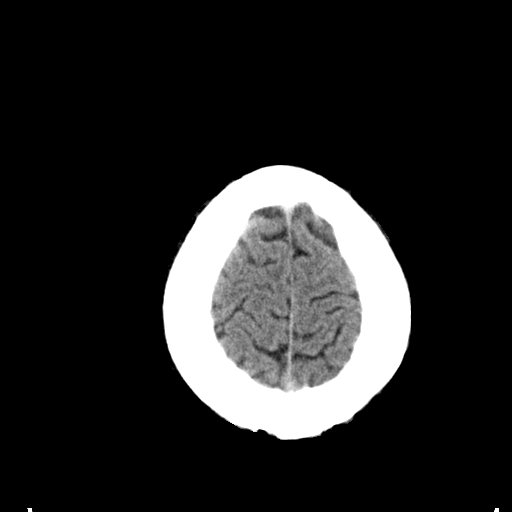
[im 27/30  brain]
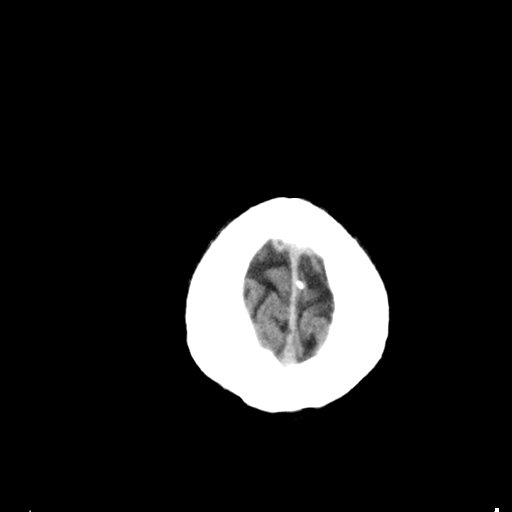
[im 29/30  brain]
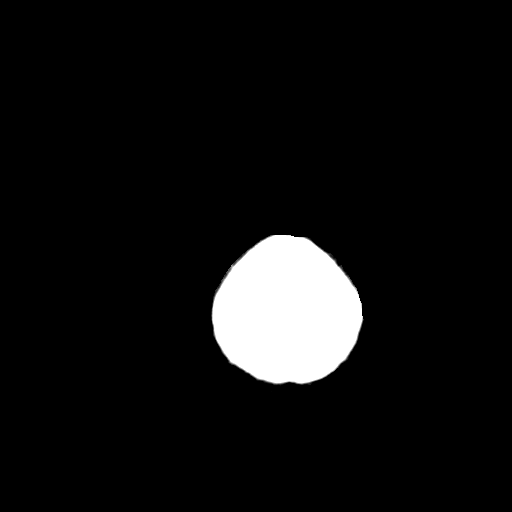

[16 of 30 positions shown; findings below may reference images not displayed]

FINDINGS: Ventricles and sulci appear symmetrical. No mass effect or midline
shift. No abnormal extra-axial fluid collections. Gray-white matter
junctions are distinct. Basal cisterns are not effaced. No evidence
of acute intracranial hemorrhage. No depressed skull fractures.
Visualized paranasal sinuses and mastoid air cells are not
opacified.
IMPRESSION: No acute intracranial abnormalities.

## 2017-02-04 IMAGING — CR DG CHEST 2V
2 series · 2 of 2 positions shown · non-contrast
Comparison: None.

CLINICAL DATA: Seizure at [DATE]

EXAM:
CHEST  2 VIEW

[chest pa]
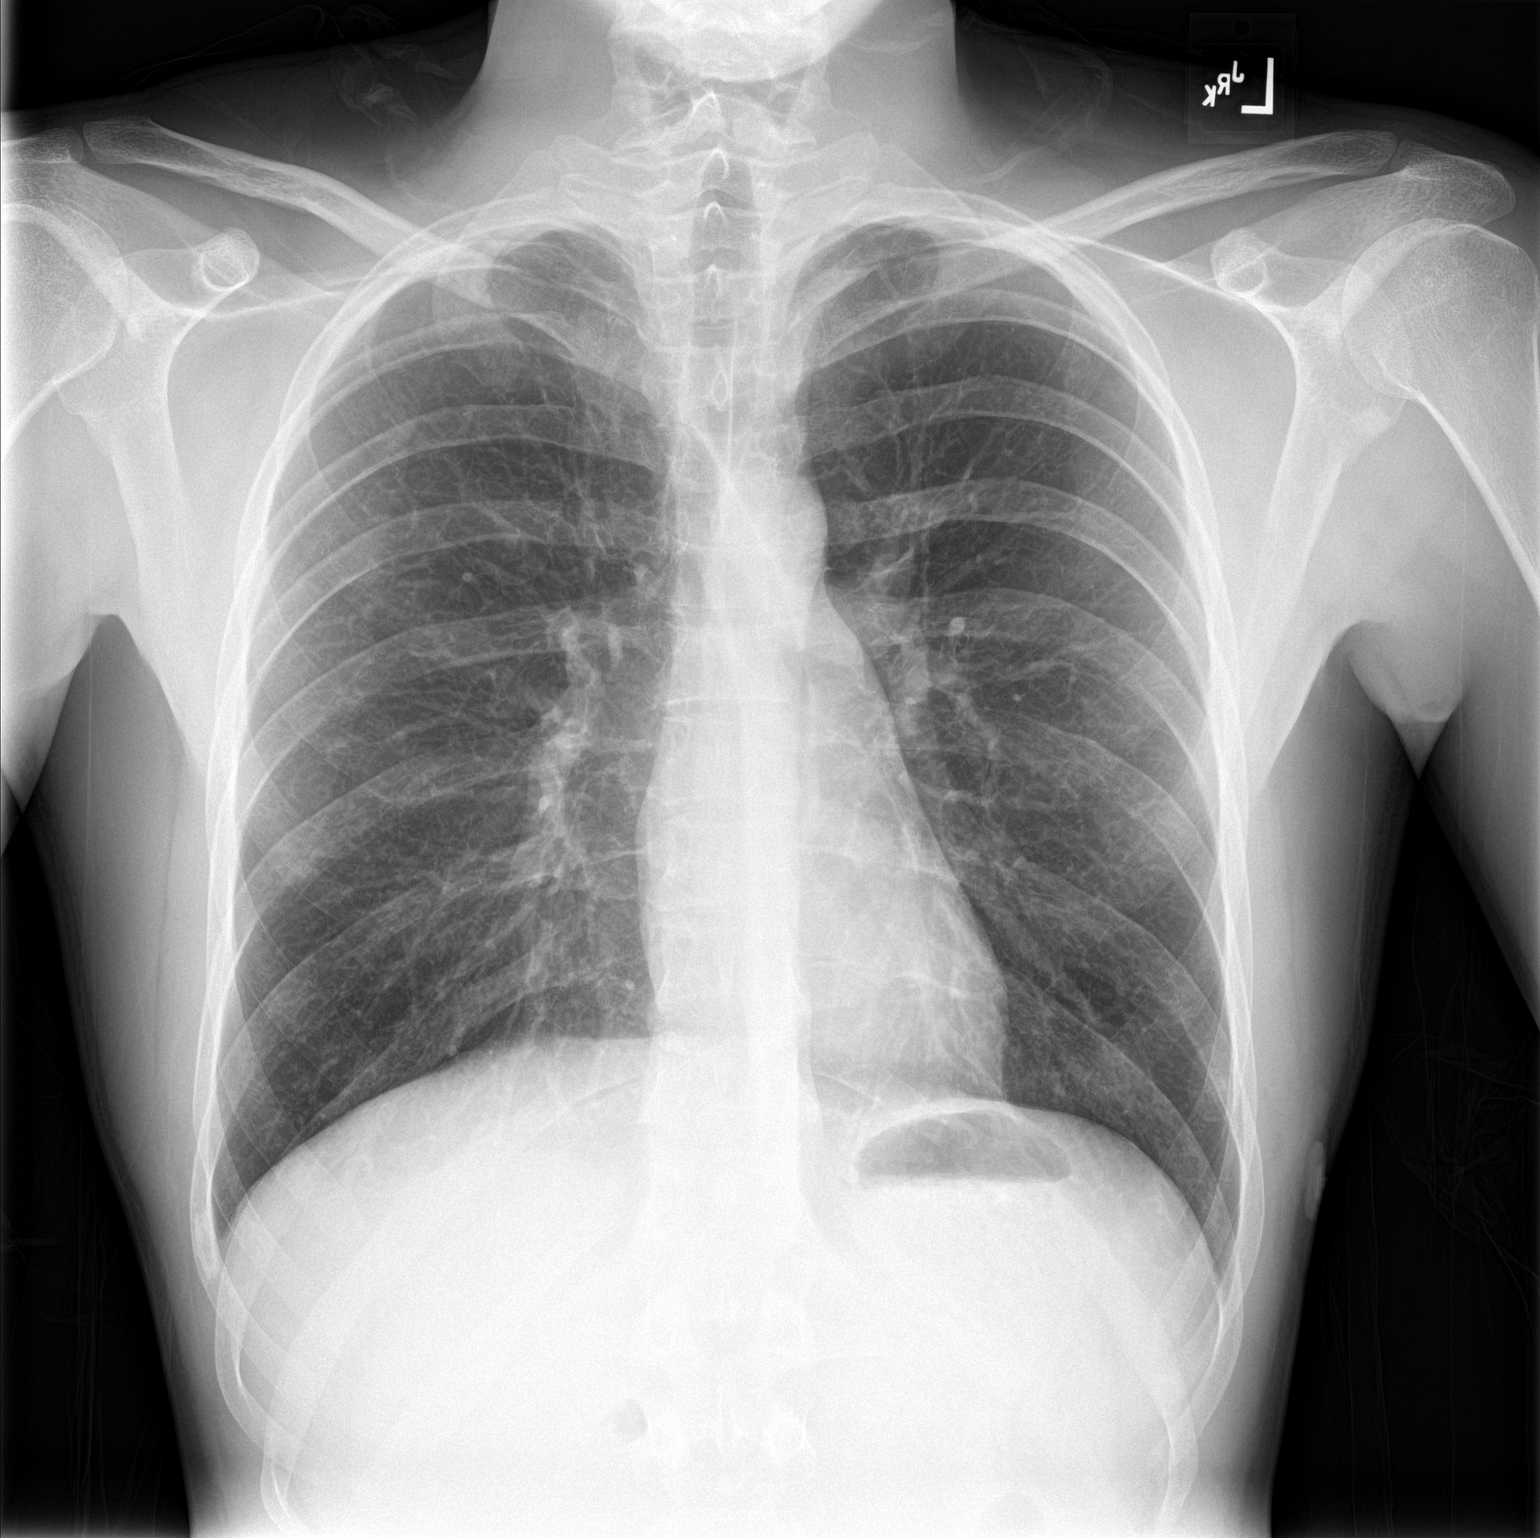

[chest lat]
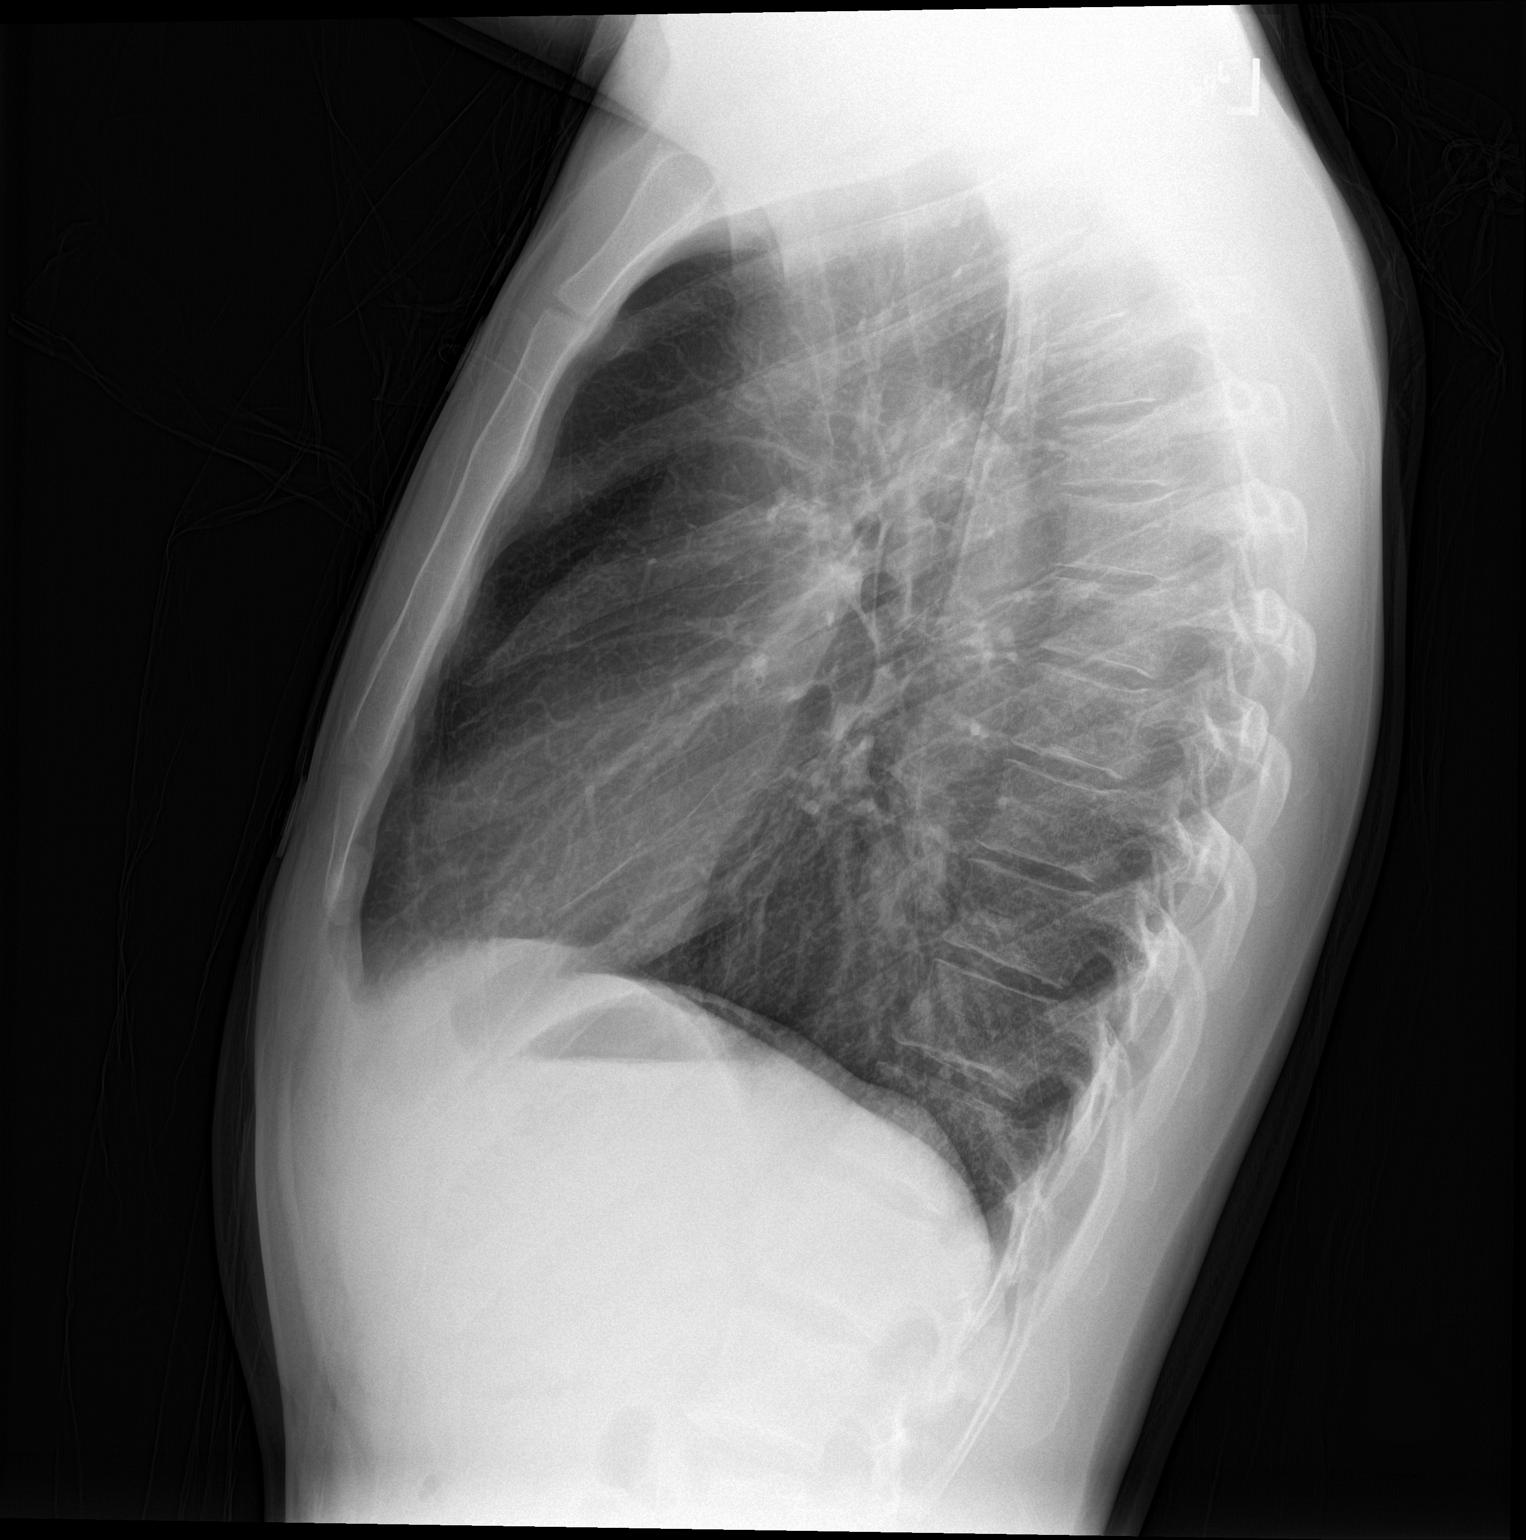

[2 of 2 positions shown; findings below may reference images not displayed]

FINDINGS: The lungs are clear. The pulmonary vasculature is normal. Heart size
is normal. Hilar and mediastinal contours are unremarkable. There is
no pleural effusion.
IMPRESSION: No active cardiopulmonary disease.

## 2017-02-16 MED ORDER — METHYLPREDNISOLONE SODIUM SUCC 125 MG IJ SOLR
INTRAMUSCULAR | Status: AC
  Filled 2017-02-16: qty 2

## 2017-05-10 MED ORDER — PENTAFLUOROPROP-TETRAFLUOROETH EX AERO
INHALATION_SPRAY | CUTANEOUS | Status: AC
  Filled 2017-05-10: qty 30

## 2020-01-24 ENCOUNTER — Telehealth: Payer: Self-pay | Admitting: Family

## 2020-01-24 DIAGNOSIS — B372 Candidiasis of skin and nail: Secondary | ICD-10-CM

## 2020-01-24 MED ORDER — FLUCONAZOLE 150 MG PO TABS: 150.0000 mg | ORAL_TABLET | ORAL | 0 refills | Status: AC | PRN

## 2020-01-24 MED ORDER — CLOTRIMAZOLE 1 % EX CREA: 1.0000 "application " | TOPICAL_CREAM | Freq: Two times a day (BID) | CUTANEOUS | 0 refills | Status: AC

## 2020-01-24 NOTE — Progress Notes (Signed)
Hello, You can use the cream that was prescribed on the last visit. I will also send in d a diflucan you can take for yeast infection. I hope you feel better soon!   Jannifer Rodney, FNP

## 2020-01-24 NOTE — Addendum Note (Signed)
Addended by: Jannifer Rodney A on: 01/24/2020 06:11 PM   Modules accepted: Orders

## 2020-01-24 NOTE — Progress Notes (Signed)
E-Visit for [employer]  We are sorry that you are not feeling well. Here is how we plan to help!  Based on what you shared with me it looks like you have tinea cruris, or "Jock Itch".  The symptoms of Jock Itch include red, peeling, itchy rash that affects the groin (crease where the leg meets the trunk).  This fungal infection can be spread through shared towels, clothing, bedding, or hard surfaces (particularly in moist areas) such as shower stalls, locker room floors, or pool area that has the fungus present. If you have a fungal infection on one part of your body, you can also spread it to other parts. For instance, men with a fungal infection on their feet sometimes spread it to their groin.  I am recommending:Clotrimazole 1% cream or gel, apply to area twice per day  If the rash does not improve you need to be seen face to face to rule out other causes.    HOME CARE:  . Keep affected area clean, dry, and cool. [place] Ivan with soap and shampoo after sports or exercise and dry yourself well after bathing or swimming . Wear cotton underwear and change them if they become damp or sweaty. . Avoid using swimming pools, public showers, or baths.  GET HELP RIGHT AWAY IF:  . Symptoms that don't away after treatment. . Severe itching that persists. . If your rash spreads or swells. . If your rash begins to have drainage or smell. . You develop a fever.  MAKE SURE YOU    Understand these instructions.  Will watch your condition.  Will get help right away if you are not doing well or get worse.  Thank you for choosing an e-visit.  Your e-visit answers were reviewed by a board certified advanced clinical practitioner to complete your personal care plan. Depending upon the condition, your plan could have included both over the counter or prescription medications.  Please review your pharmacy choice. Make sure the pharmacy is open so you can pick up prescription now. If there is a problem,  you may contact your provider through [employer] and have the prescription routed to another pharmacy.  Your safety is important to [place]. If you have drug allergies check your prescription carefully.   For the next 24 hours you can use MyChart to ask questions about today's visit, request a non-urgent call back, or ask for a work or school excuse.  You will get an email in the next two days asking about your experience. I hope that your e-visit has been valuable and will speed your recovery   References or for more information:  [email or web address]=search_[email or web address]=[email or web address]~52&usage_type=default&display_rank=3  Approximately 5 minutes was spent documenting and reviewing patient's chart.

## 2020-08-08 ENCOUNTER — Ambulatory Visit: Payer: Self-pay | Attending: Internal Medicine

## 2020-08-08 DIAGNOSIS — Z23 Encounter for immunization: Secondary | ICD-10-CM

## 2020-08-08 NOTE — Progress Notes (Signed)
   Covid-19 Vaccination Clinic  Name:  Leonel Mccollum    MRN: [id number] DOB: 10/14/1991  08/08/2020  Mr. Dehn was observed post Covid-19 immunization for 15 minutes without incident. He was provided with Vaccine Information Sheet and instruction to access the V-Safe system.   Mr. Musial was instructed to call 911 with any severe reactions post vaccine: Marland Kitchen Difficulty breathing  . Swelling of face and throat  . A fast heartbeat  . A bad rash all over body  . Dizziness and weakness   Immunizations Administered    Name Date Dose VIS Date Route   Pfizer COVID-19 Vaccine 08/08/2020 12:06 PM 0.3 mL 02/09/2019 Intramuscular   Manufacturer: [employer]   Lot: [id number]   NDC: [address]-4

## 2020-08-29 ENCOUNTER — Ambulatory Visit: Payer: Self-pay
